# Patient Record
Sex: Male | Born: 2005
Health system: Southern US, Community
[De-identification: ages and names within clinical notes are randomized; demographics above are authoritative.]

## PROBLEM LIST (undated history)

## (undated) DIAGNOSIS — R040 Epistaxis: Secondary | ICD-10-CM

## (undated) HISTORY — PX: TONSILLECTOMY: SUR1361

## (undated) HISTORY — DX: Epistaxis: R04.0

## (undated) HISTORY — PX: ADENOIDECTOMY: SUR15

---
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
## 2024-03-03 ENCOUNTER — Encounter: Payer: Self-pay | Admitting: Family Medicine

## 2024-03-03 ENCOUNTER — Ambulatory Visit: Payer: Self-pay | Admitting: Family Medicine

## 2024-03-03 VITALS — BP 132/87 | HR 79 | Ht 74.0 in | Wt 247.0 lb

## 2024-03-03 DIAGNOSIS — L02519 Cutaneous abscess of unspecified hand: Secondary | ICD-10-CM | POA: Diagnosis not present

## 2024-03-03 DIAGNOSIS — L03119 Cellulitis of unspecified part of limb: Secondary | ICD-10-CM

## 2024-03-03 MED ORDER — CEFTRIAXONE SODIUM 1 G IJ SOLR
1.0000 g | Freq: Once | INTRAMUSCULAR | Status: AC
  Administered 2024-03-03: 1 g via INTRAMUSCULAR

## 2024-03-03 MED ORDER — SULFAMETHOXAZOLE-TRIMETHOPRIM 800-160 MG PO TABS: 1.0000 | ORAL_TABLET | Freq: Two times a day (BID) | ORAL | 0 refills | Status: DC

## 2024-03-03 NOTE — Progress Notes (Signed)
 BP 132/87   Pulse 79   Ht 6' 2 (1.88 m)   Wt 247 lb (112 kg)   SpO2 98%   BMI 31.71 kg/m    Subjective:   Patient ID: Neil Bartlett, male    DOB: 07/01/2006, 18 y.o.   MRN: [id number]  HPI: Neil Bartlett is a 18 y.o. male presenting on 03/03/2024 for Cellulitis (Right hand. Started last Friday.)   HPI Swelling and infection in right hand Patient is coming in today for swelling and infection in right hand.  He says it started initially with some poison ivy and he got some blisters but then over the past 3 days he has had significant swelling in the hand and between the 2 fingers and develop a draining abscess between the 2 fingers.  He has significant pain and swelling especially between his 4th and 5th finger in the web of the hand.  Relevant past medical, surgical, family and social history reviewed and updated as indicated. Interim medical history since our last visit reviewed. Allergies and medications reviewed and updated.  Review of Systems  Constitutional:  Negative for chills and fever.  Respiratory:  Negative for shortness of breath and wheezing.   Cardiovascular:  Negative for chest pain and leg swelling.  Musculoskeletal:  Positive for arthralgias and joint swelling. Negative for back pain and gait problem.  Skin:  Positive for color change and wound. Negative for rash.  All other systems reviewed and are negative.   Per HPI unless specifically indicated above   Allergies as of 03/03/2024   No Known Allergies      Medication List        Accurate as of March 03, 2024  9:49 AM. If you have any questions, ask your nurse or doctor.          fluticasone  50 MCG/ACT nasal spray Commonly known as: FLONASE  Place 1 spray into both nostrils 2 (two) times daily as needed for allergies or rhinitis.   sulfamethoxazole -trimethoprim  800-160 MG tablet Commonly known as: BACTRIM  DS Take 1 tablet by mouth 2 (two) times daily. Started by: Fonda [facility]OR Elease Swarm          Objective:   BP 132/87   Pulse 79   Ht 6' 2 (1.88 m)   Wt 247 lb (112 kg)   SpO2 98%   BMI 31.71 kg/m   Wt Readings from Last 3 Encounters:  03/03/24 247 lb (112 kg)  03/27/21 (!) 232 lb (105.2 kg) (99%, Z= 2.27)*  02/04/20 220 lb 7.4 oz (100 kg) (99%, Z= 2.29)*   * Growth percentiles are based on CDC (Boys, 2-20 Years) data.    Physical Exam Vitals and nursing note reviewed.  Constitutional:      Appearance: Normal appearance.  Skin:    Findings: Abscess present.     Comments: Swelling and enlarged blister with abscess between the 4th and 5th fingers in the web and swelling up into the hands.  Range of motion is diminished because of swelling but able to move all of his fingers and sensation is intact.  Open blister unable to drain straw-colored and purulent fluid.  Neurological:     Mental Status: He is alert.       Assessment & Plan:   Problem List Items Addressed This Visit   None Visit Diagnoses       Cellulitis and abscess of hand    -  Primary   Relevant Medications   sulfamethoxazole -trimethoprim  (BACTRIM  DS) 800-160 MG tablet  cefTRIAXone  (ROCEPHIN ) injection 1 g       [place] 1 g Rocephin  and Bactrim  and have him come back in a week. Follow up plan: Return if symptoms worsen or fail to improve, for 1 week follow-up abscess.  Counseling provided for all of the vaccine components No orders of the defined types were placed in this encounter.   Fonda Levins, MD [facility] 03/03/2024, 9:49 AM

## 2024-03-08 ENCOUNTER — Encounter: Payer: Self-pay | Admitting: Family Medicine

## 2024-03-08 ENCOUNTER — Ambulatory Visit: Admitting: Family Medicine

## 2024-03-08 DIAGNOSIS — L03119 Cellulitis of unspecified part of limb: Secondary | ICD-10-CM | POA: Diagnosis not present

## 2024-03-08 DIAGNOSIS — L02519 Cutaneous abscess of unspecified hand: Secondary | ICD-10-CM

## 2024-03-08 MED ORDER — SULFAMETHOXAZOLE-TRIMETHOPRIM 800-160 MG PO TABS: 1.0000 | ORAL_TABLET | Freq: Two times a day (BID) | ORAL | 0 refills | Status: AC

## 2024-03-08 NOTE — Progress Notes (Signed)
 BP 129/79   Pulse 80   Ht 6' 2 (1.88 m)   Wt 249 lb (112.9 kg)   SpO2 97%   BMI 31.97 kg/m    Subjective:   Patient ID: Neil Bartlett, male    DOB: 08-23-2005, 18 y.o.   MRN: [id number]  HPI: Neil Bartlett is a 18 y.o. male presenting on 03/08/2024 for Cellulitis right hand   HPI Hand cellulitis recheck He says the drainage and pain and swelling has gone down significantly.  He still does have a little bit of drainage out of it.  He is able to move it more but is still has a lot of pain in there when he moves it or uses it too much.  He denies any fevers or chills or bodyaches.  He did finish the course of Bactrim  yesterday.  Relevant past medical, surgical, family and social history reviewed and updated as indicated. Interim medical history since our last visit reviewed. Allergies and medications reviewed and updated.  Review of Systems  Constitutional:  Negative for chills and fever.  Eyes:  Negative for visual disturbance.  Respiratory:  Negative for shortness of breath and wheezing.   Cardiovascular:  Negative for chest pain and leg swelling.  Musculoskeletal:  Negative for back pain and gait problem.  Skin:  Positive for rash and wound. Negative for color change.  All other systems reviewed and are negative.   Per HPI unless specifically indicated above   Allergies as of 03/08/2024   No Known Allergies      Medication List        Accurate as of March 08, 2024  9:23 AM. If you have any questions, ask your nurse or doctor.          fluticasone  50 MCG/ACT nasal spray Commonly known as: FLONASE  Place 1 spray into both nostrils 2 (two) times daily as needed for allergies or rhinitis.   sulfamethoxazole -trimethoprim  800-160 MG tablet Commonly known as: BACTRIM  DS Take 1 tablet by mouth 2 (two) times daily.         Objective:   BP 129/79   Pulse 80   Ht 6' 2 (1.88 m)   Wt 249 lb (112.9 kg)   SpO2 97%   BMI 31.97 kg/m   Wt Readings from Last 3  Encounters:  03/08/24 249 lb (112.9 kg)  03/03/24 247 lb (112 kg)  03/27/21 (!) 232 lb (105.2 kg) (99%, Z= 2.27)*   * Growth percentiles are based on CDC (Boys, 2-20 Years) data.    Physical Exam Vitals and nursing note reviewed.  Constitutional:      General: He is not in acute distress.    Appearance: He is well-developed. He is not diaphoretic.  Eyes:     General: No scleral icterus.    Conjunctiva/sclera: Conjunctivae normal.  Neck:     Thyroid: No thyromegaly.  Skin:    General: Skin is warm and dry.     Findings: Erythema and wound (Small area that is able to express some purulent material on the dorsum of his right hand near the webbing.  Still cracked and open between the webbing of the 4th and 5th digit and has scabbing on the area between the fingers.  Only small amount of erythem) present.     Comments: Swelling minimal at this point.  Neurological:     Mental Status: He is alert and oriented to person, place, and time.     Coordination: Coordination normal.  Psychiatric:  Behavior: Behavior normal.       Assessment & Plan:   Problem List Items Addressed This Visit   None Visit Diagnoses       Cellulitis and abscess of hand       Relevant Medications   sulfamethoxazole -trimethoprim  (BACTRIM  DS) 800-160 MG tablet       Send a refill of the antibiotic, will see back in 1 week, much improved, also instructed him to put Vaseline on the wound to get it to heal now.  Off work for 1 week as well. Follow up plan: Return if symptoms worsen or fail to improve.  Counseling provided for all of the vaccine components No orders of the defined types were placed in this encounter.   Fonda Levins, MD [facility] 03/08/2024, 9:23 AM

## 2024-03-10 ENCOUNTER — Ambulatory Visit: Admitting: Family Medicine

## 2024-03-15 ENCOUNTER — Encounter: Payer: Self-pay | Admitting: Family Medicine

## 2024-03-15 ENCOUNTER — Ambulatory Visit: Admitting: Family Medicine

## 2024-03-15 VITALS — BP 139/88 | HR 89 | Ht 74.0 in | Wt 254.0 lb

## 2024-03-15 DIAGNOSIS — L03119 Cellulitis of unspecified part of limb: Secondary | ICD-10-CM

## 2024-03-15 DIAGNOSIS — L02519 Cutaneous abscess of unspecified hand: Secondary | ICD-10-CM | POA: Diagnosis not present

## 2024-03-15 NOTE — Progress Notes (Signed)
 BP 139/88   Pulse 89   Ht 6' 2 (1.88 m)   Wt 254 lb (115.2 kg)   SpO2 96%   BMI 32.61 kg/m    Subjective:   Patient ID: Neil Bartlett, male    DOB: October 12, 2005, 18 y.o.   MRN: [id number]  HPI: Neil Bartlett is a 18 y.o. male presenting on 03/15/2024 for Cellulitis (Right hand)   Discussed the use of AI scribe software for clinical note transcription with the patient, who gave verbal consent to proceed.  History of Present Illness   Neil Bartlett is a 18 year old male who presents with a wound on his hand.  He has been treating the wound on his hand with antibiotics but missed a couple of doses. He experiences mild pain on the palm where the skin peels, but no other pain elsewhere. There is no drainage from the wound. The area between two fingers is scarred.  He wears gloves at work and does not require a note to return to work. No pain travels up his arm, and there is no swelling elsewhere.      Relevant past medical, surgical, family and social history reviewed and updated as indicated. Interim medical history since our last visit reviewed. Allergies and medications reviewed and updated.  Review of Systems  Constitutional:  Negative for activity change, chills and fatigue.  Skin:  Positive for wound. Negative for color change and rash.  All other systems reviewed and are negative.   Per HPI unless specifically indicated above   Allergies as of 03/15/2024   No Known Allergies      Medication List        Accurate as of March 15, 2024  9:47 AM. If you have any questions, ask your nurse or doctor.          fluticasone  50 MCG/ACT nasal spray Commonly known as: FLONASE  Place 1 spray into both nostrils 2 (two) times daily as needed for allergies or rhinitis.   sulfamethoxazole -trimethoprim  800-160 MG tablet Commonly known as: BACTRIM  DS Take 1 tablet by mouth 2 (two) times daily.         Objective:   BP 139/88   Pulse 89   Ht 6' 2 (1.88 m)   Wt 254 lb  (115.2 kg)   SpO2 96%   BMI 32.61 kg/m   Wt Readings from Last 3 Encounters:  03/15/24 254 lb (115.2 kg)  03/08/24 249 lb (112.9 kg)  03/03/24 247 lb (112 kg)    Physical Exam Physical Exam   EXTREMITIES: Reduced swelling in hand, no signs of infection, normal strength.      Wound between his 4th and 5th digits on his right hand in the webspace has some cysts scab on the dorsum and some peeling skin on the palmar side.  No signs of purulence or induration.  Still slightly tender   Assessment & Plan:   Problem List Items Addressed This Visit   None Visit Diagnoses       Cellulitis and abscess of hand    -  Primary           Healing wound of hand with residual scarring Healing wound on palm with residual scarring and mild pain. No active infection. Possible scar tissue causing soreness. Strength and range of motion intact. - Continue antibiotic regimen until completion. - Apply moisturizer or hand cream twice daily. - Ice area as needed for swelling. - Wash wound with soap and water daily, especially after  work. - Product manager at work, remove to air out wound. - Return if signs of infection: increased redness, pus, worsening pain.          Follow up plan: Return if symptoms worsen or fail to improve.  Counseling provided for all of the vaccine components No orders of the defined types were placed in this encounter.   Fonda Levins, MD [facility] 03/15/2024, 9:47 AM
